# Patient Record
Sex: Male | Born: 1990 | Race: White | Hispanic: No | Marital: Single | Smoking: Never smoker
Health system: Southern US, Community
[De-identification: ages and names within clinical notes are randomized; demographics above are authoritative.]

## PROBLEM LIST (undated history)

## (undated) DIAGNOSIS — J309 Allergic rhinitis, unspecified: Secondary | ICD-10-CM

## (undated) DIAGNOSIS — F988 Other specified behavioral and emotional disorders with onset usually occurring in childhood and adolescence: Secondary | ICD-10-CM

## (undated) DIAGNOSIS — I10 Essential (primary) hypertension: Secondary | ICD-10-CM

## (undated) HISTORY — DX: Essential (primary) hypertension: I10

## (undated) HISTORY — DX: Other specified behavioral and emotional disorders with onset usually occurring in childhood and adolescence: F98.8

## (undated) HISTORY — DX: Allergic rhinitis, unspecified: J30.9

---
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
## 2010-07-25 ENCOUNTER — Emergency Department (HOSPITAL_COMMUNITY): Admission: EM | Admit: 2010-07-25 | Discharge: 2010-07-25 | Payer: Self-pay | Admitting: Emergency Medicine

## 2011-01-21 ENCOUNTER — Encounter: Payer: Self-pay | Admitting: Sports Medicine

## 2011-03-17 LAB — CBC
HCT: 47.2 % (ref 39.0–52.0)
Hemoglobin: 16.7 g/dL (ref 13.0–17.0)
MCH: 32.3 pg (ref 26.0–34.0)
Platelets: 156 10*3/uL (ref 150–400)
RDW: 12.8 % (ref 11.5–15.5)

## 2011-03-17 LAB — COMPREHENSIVE METABOLIC PANEL
ALT: 22 U/L (ref 0–53)
AST: 21 U/L (ref 0–37)
Albumin: 4.3 g/dL (ref 3.5–5.2)
BUN: 10 mg/dL (ref 6–23)
Chloride: 107 mEq/L (ref 96–112)
GFR calc Af Amer: >60 mL/min (ref >60)
GFR calc non Af Amer: >60 mL/min (ref >60)
Potassium: 4 mEq/L (ref 3.5–5.1)
Total Bilirubin: 1.3 mg/dL — ABNORMAL HIGH (ref 0.3–1.2)

## 2011-03-17 LAB — URINALYSIS, ROUTINE W REFLEX MICROSCOPIC
Bilirubin Urine: NEGATIVE
Glucose, UA: NEGATIVE mg/dL
Hgb urine dipstick: NEGATIVE
Ketones, ur: NEGATIVE mg/dL
Nitrite: NEGATIVE
pH: 6.5 (ref 5.0–8.0)

## 2011-03-17 LAB — DIFFERENTIAL
Eosinophils Absolute: 0.3 10*3/uL (ref 0.0–0.7)
Eosinophils Relative: 3 % (ref 0–5)
Lymphs Abs: 1.7 10*3/uL (ref 0.7–4.0)
Monocytes Absolute: 0.8 10*3/uL (ref 0.1–1.0)
Neutrophils Relative %: 67 % (ref 43–77)

## 2011-05-15 NOTE — Op Note (Signed)
NAMEAVIK, Charles NO.:  [id number]   MEDICAL RECORD NO.:  [id number]          PATIENT TYPE:  AMB   LOCATION:  DSC                          FACILITY:  [facility]   PHYSICIAN:  Mila Homer. Sherlean Foot, M.D. DATE OF BIRTH:  1991/05/24   DATE OF PROCEDURE:  11/07/2007  DATE OF DISCHARGE:                               OPERATIVE REPORT   SURGEON:  Mila Homer. Sherlean Foot, M.D.   ASSISTANTArlys John D. Petrarca, P.A.-C.   ANESTHESIA:  General.   PREOPERATIVE DIAGNOSIS:  Left knee anterior cruciate ligament tear.   POSTOPERATIVE DIAGNOSIS:  Left knee anterior cruciate ligament tear.   PROCEDURE:  Left knee anterior cruciate ligament reconstruction.   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  MRI showed small vertical  posterior tears in the horns of the medial and lateral menisci and a  full-thickness ACL tear.  Informed consent obtained.   DESCRIPTION OF PROCEDURE:  The patient was laid supine and administered  general anesthesia after preoperative femoral nerve block.  Left leg was  prepped and draped in usual sterile fashion.  Inferolateral and  inferomedial portals were created with a #11 blade, blunt trocar and  cannula.  Diagnostic arthroscopy revealed no chondromalacia in the knee.  When checked, the menisci and both tears were very, very small on the  undersurface and  very stable, and I did not feel it needed any devices  for repair.  I felt that they would heal on their own. They were in the  red/red zone and again very, very small and stable.  The ACL was fully  torn.  The notch was very narrow.  I think this was probably the  pathologic findings that predisposed him to ACL rupture.  I then put  removed the scope which was to the inferolateral portal only, made the  harvest incision after putting up the tourniquet to 350 mmHg.  This ran  from the inferior pole of patella to the tibial tubercle and  was made  with a #10 blade.  I then used a 15 blade to elevate the peritenon off  the tendon using 10-mm catamaran blade to take the central 1/3.  I then  used __________  to take a 10 x 10 x 25-mm bone plug of the patella and  tibial tubercle while the PA prepared the graft on the back table.  I  then put the scope back in inferolateral portal, made an inferomedial  portal through the harvest incision.  I then finished removal of ACL,  performed a notchplasty with a 4-mm cylindrical bur.  I then used the  Arthrex system to make a 10-mm tibial and femoral tunnels, passed the  graft with the Beath needle.  I then placed a 7 x 25-mm bone plug in the  femur and an 8 x 25 in the tibia.  The graft looked excellent in the  knee.  Drawer and Lachman were negative.  I then lavaged the knee,  closed with 0 and 2-0 Vicryl sutures,  Steri-Strips, Xeroform dressing,  sponges, Ace wrap and knee immobilizer.  Tourniquet time was 62 minutes.   COMPLICATIONS:  None.   DRAINS:  None.           ______________________________  Mila Homer. Sherlean Foot, M.D.     SDL/MEDQ  D:  11/07/2007  T:  11/07/2007  Job:  [id number]

## 2011-10-09 LAB — POCT HEMOGLOBIN-HEMACUE
Hemoglobin: 16.2 — ABNORMAL HIGH
Operator id: 123881

## 2012-07-20 IMAGING — CT CT ABD-PELV W/ CM
1 series · 15 of 32 positions shown, 19 images · IV contrast (agent unspecified)
Comparison: MRI [id number]

CLINICAL DATA: Right lower quadrant abdominal pain.

CT ABDOMEN AND PELVIS WITH CONTRAST
TECHNIQUE: Multidetector CT imaging of the abdomen and pelvis was
performed following the standard protocol during bolus
administration of intravenous contrast.
Contrast: 100 ml Lmnipaque-XBB

[Series 2: rtn ap with st · axial · 0.74mm/px · z∈[-280,+170]mm · 15 of 101 slices shown, 19 images]
[im 7/101  soft-tissue]
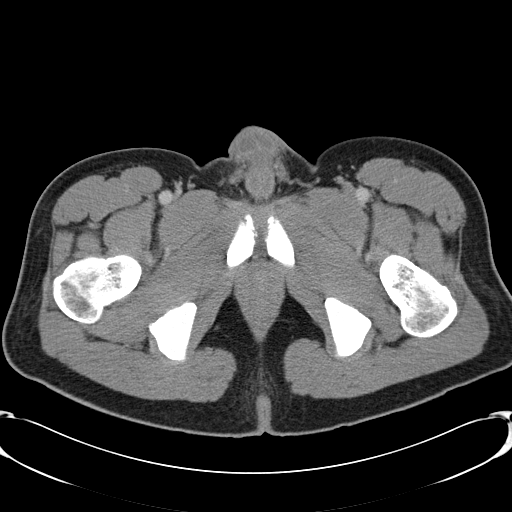
[im 7/101  bone]
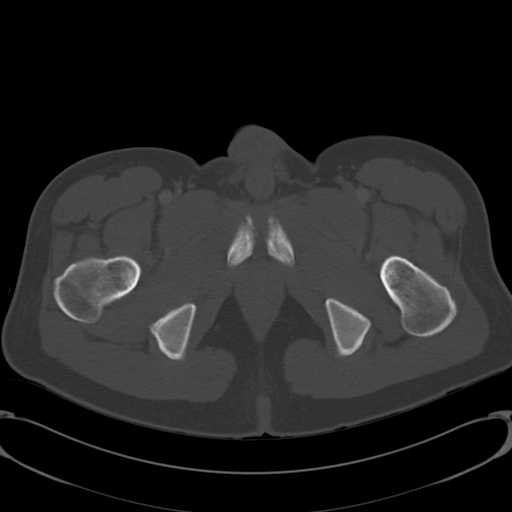
[im 13/101  soft-tissue]
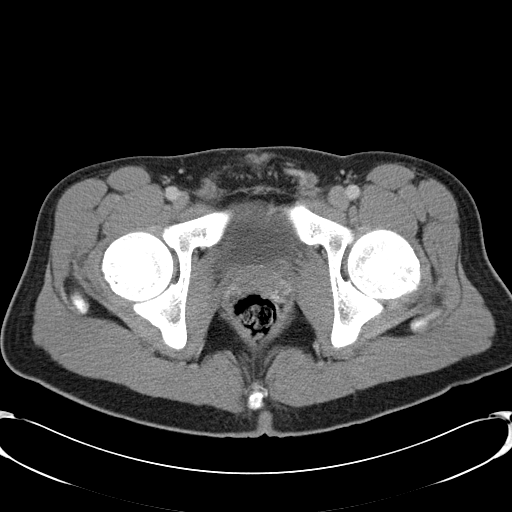
[im 20/101  soft-tissue]
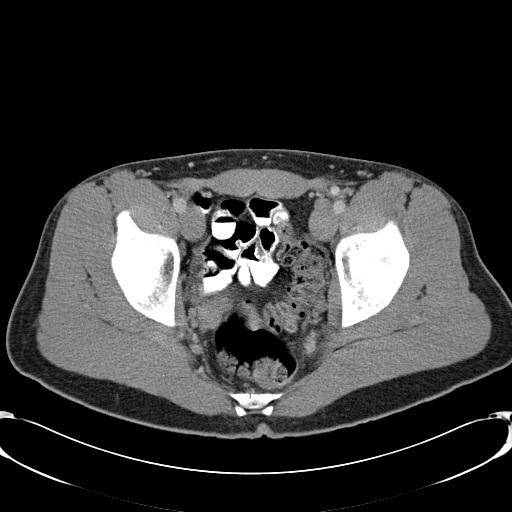
[im 30/101  soft-tissue]
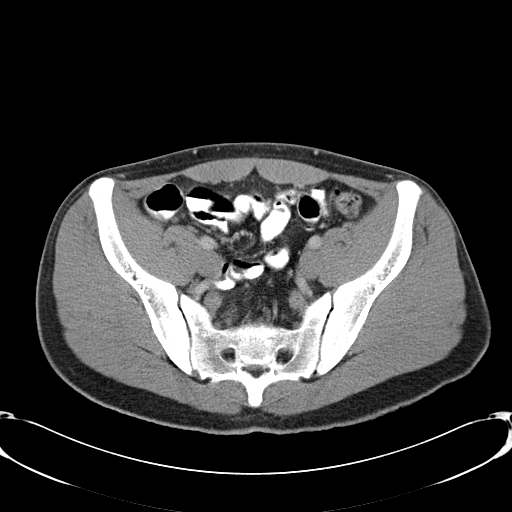
[im 36/101  soft-tissue]
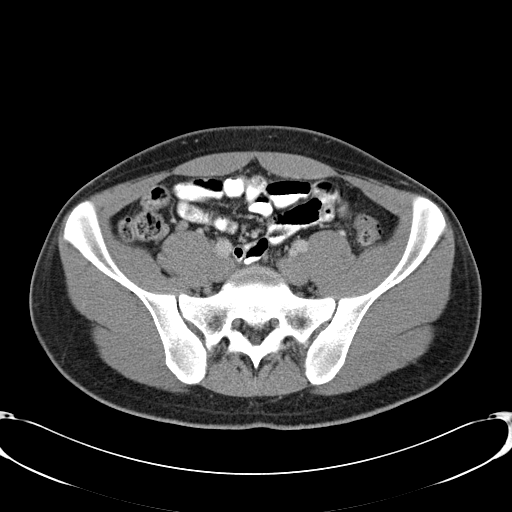
[im 42/101  soft-tissue]
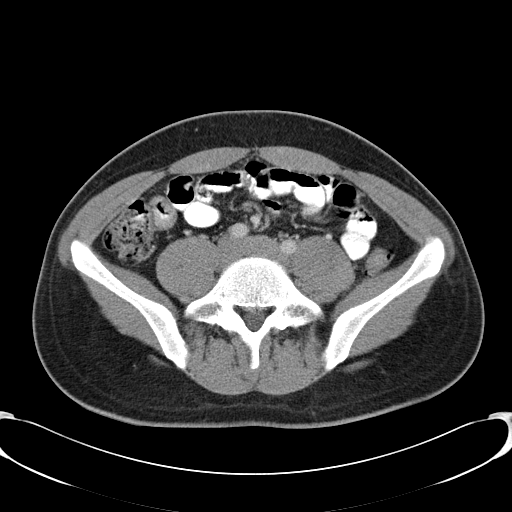
[im 52/101  soft-tissue]
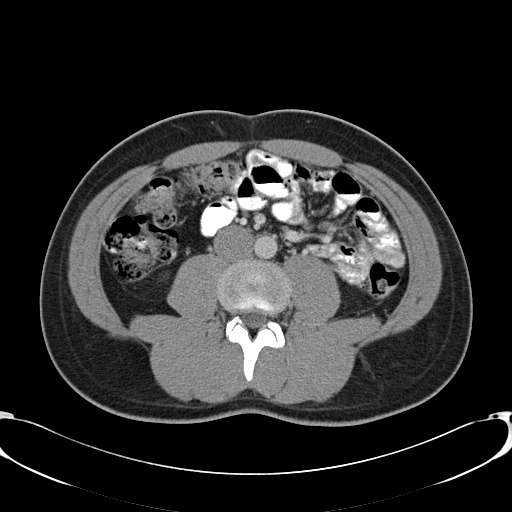
[im 59/101  soft-tissue]
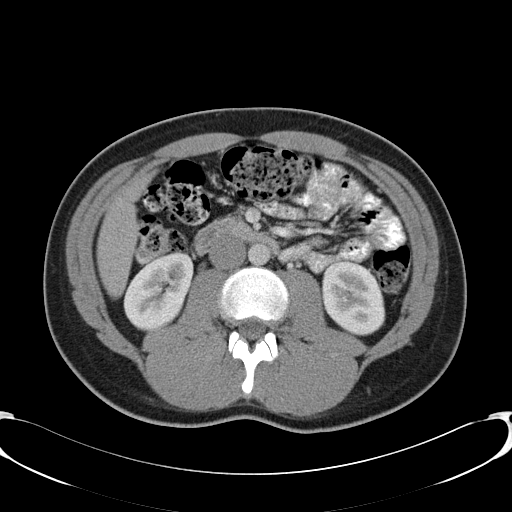
[im 65/101  soft-tissue]
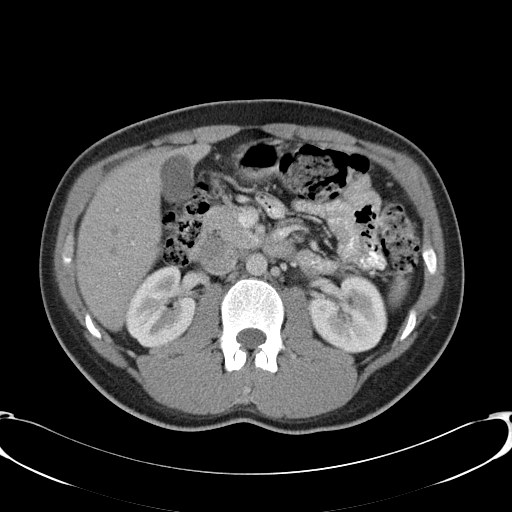
[im 65/101  bone]
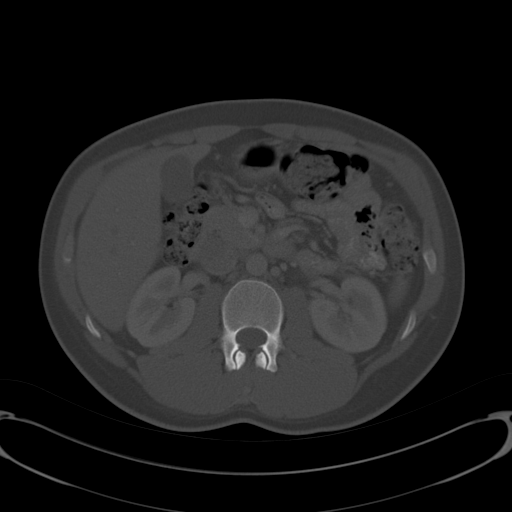
[im 71/101  soft-tissue]
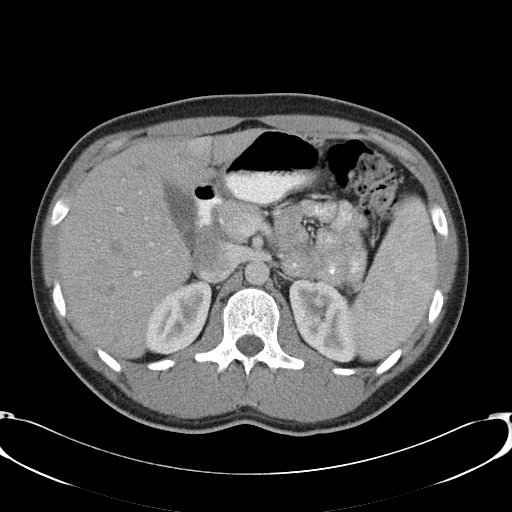
[im 81/101  soft-tissue]
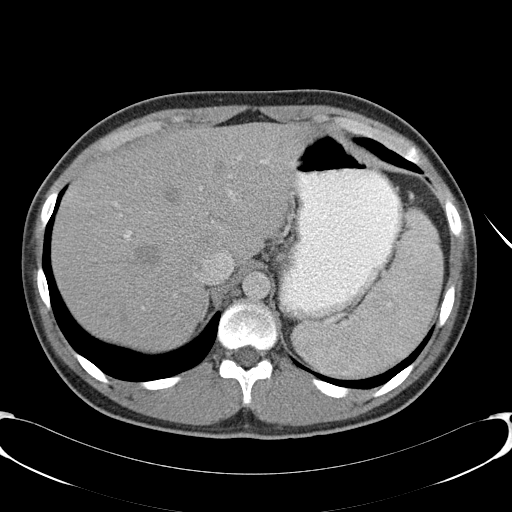
[im 88/101  soft-tissue]
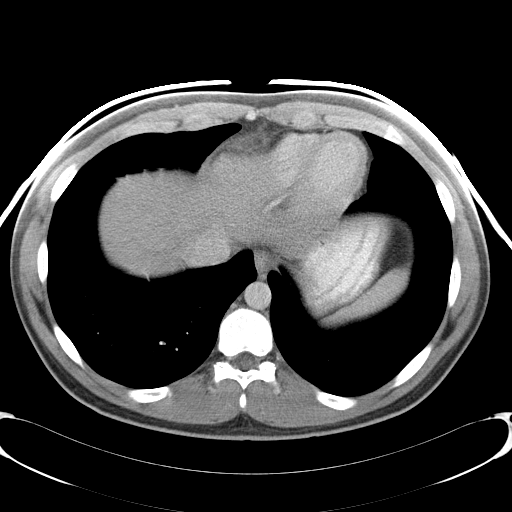
[im 88/101  lung]
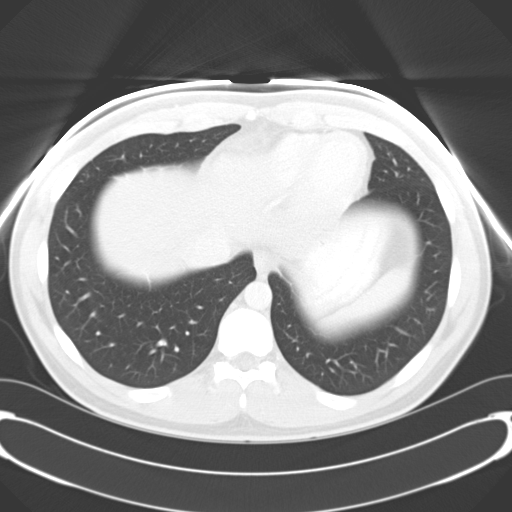
[im 91/101  lung]
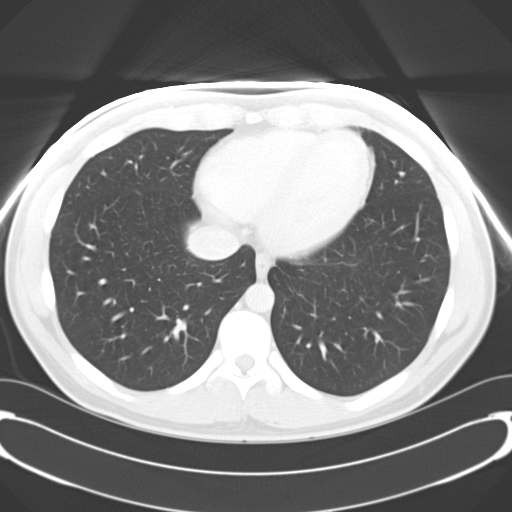
[im 94/101  soft-tissue]
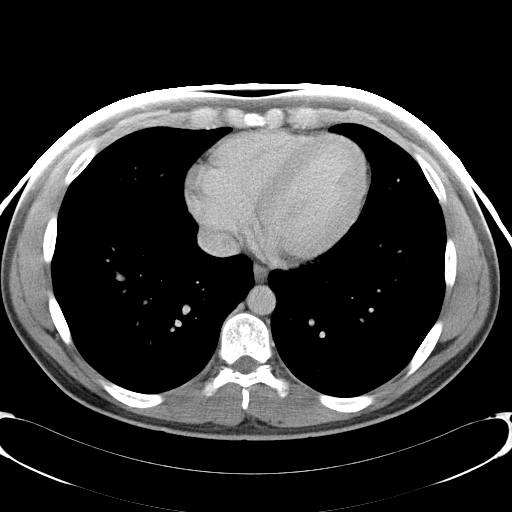
[im 94/101  lung]
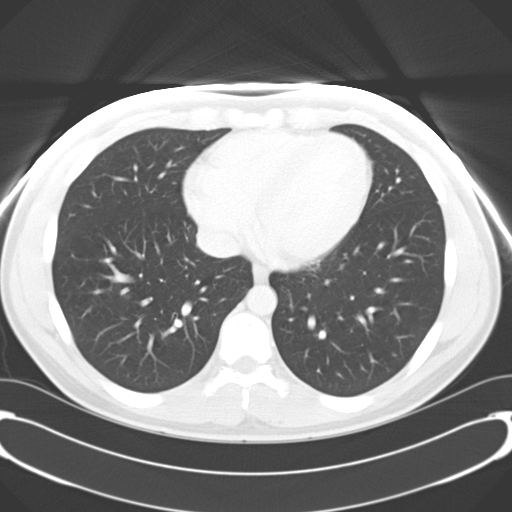
[im 97/101  lung]
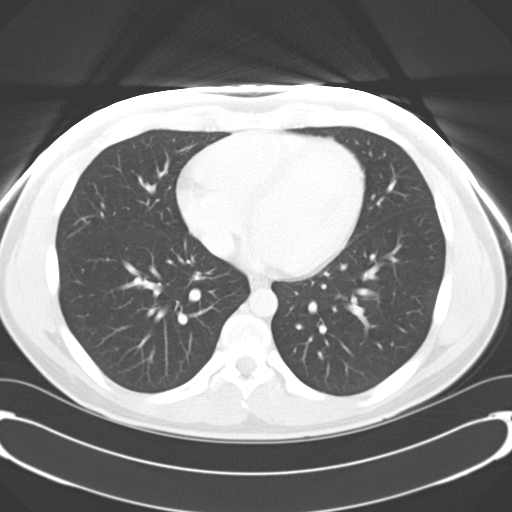

[15 of 32 positions shown; findings below may reference images not displayed]

FINDINGS: Lung bases are clear.  No pleural or pericardial fluid.
The liver, gallbladder, spleen, pancreas, adrenal glands and
kidneys are normal.  The aorta and IVC are normal.  No free fluid
or air.  Bladder, prostate gland and seminal vesicles are
unremarkable.

There is a fairly large amount of stool within the colon that could
possibly be symptomatic.

I think the appendix is a diminutive structure at the cecal tip.
No sign of appendicitis.

There is slightly prominent nodes in the mesentery in the right
lower quadrant.  This could indicate mesenteric adenitis.

The terminal ileum is questionably minimally thick-walled.  This is
not definitely abnormal.  It is conceivable there could be mild
viral enteritis.
IMPRESSION: No sign of appendicitis.  The appendix is a diminutive structure at
the cecal tip that does not show any inflammation.

Slightly prominent lymph nodes in the right lower quadrant
mesentery could indicate mesenteric adenitis.

Question minimal thickening of the terminal ileum.  This is not
definite.  This could possibly be associated with mild viral
inflammation.

## 2013-11-25 ENCOUNTER — Telehealth: Payer: Self-pay | Admitting: Internal Medicine

## 2013-11-25 ENCOUNTER — Other Ambulatory Visit: Payer: Self-pay | Admitting: Internal Medicine

## 2013-11-25 DIAGNOSIS — F988 Other specified behavioral and emotional disorders with onset usually occurring in childhood and adolescence: Secondary | ICD-10-CM | POA: Insufficient documentation

## 2013-11-25 MED ORDER — AMPHETAMINE-DEXTROAMPHETAMINE 10 MG PO TABS: 10.0000 mg | ORAL_TABLET | Freq: Every day | ORAL | Status: DC

## 2013-11-25 NOTE — Telephone Encounter (Signed)
Pt requesting Adderall  rx  No appt shcd

## 2013-12-01 ENCOUNTER — Other Ambulatory Visit: Payer: Self-pay | Admitting: Physician Assistant

## 2013-12-01 DIAGNOSIS — F988 Other specified behavioral and emotional disorders with onset usually occurring in childhood and adolescence: Secondary | ICD-10-CM

## 2013-12-01 MED ORDER — AMPHETAMINE-DEXTROAMPHETAMINE 10 MG PO TABS: 10.0000 mg | ORAL_TABLET | Freq: Every day | ORAL | Status: DC

## 2014-02-04 ENCOUNTER — Other Ambulatory Visit: Payer: Self-pay | Admitting: Physician Assistant

## 2014-02-04 DIAGNOSIS — F988 Other specified behavioral and emotional disorders with onset usually occurring in childhood and adolescence: Secondary | ICD-10-CM

## 2014-02-04 MED ORDER — PIROXICAM 20 MG PO CAPS: 20.0000 mg | ORAL_CAPSULE | Freq: Every day | ORAL | Status: DC | PRN

## 2014-02-04 MED ORDER — AMPHETAMINE-DEXTROAMPHETAMINE 10 MG PO TABS: 10.0000 mg | ORAL_TABLET | Freq: Three times a day (TID) | ORAL | Status: DC

## 2014-02-09 NOTE — Progress Notes (Signed)
LMOM TO CALL & SCHEDULE A F/U

## 2014-03-26 NOTE — Progress Notes (Signed)
LEFT MESSAGE FOR PT TO CALL AND SCH OV ° ° °

## 2014-04-20 ENCOUNTER — Other Ambulatory Visit: Payer: Self-pay | Admitting: Internal Medicine

## 2014-04-20 DIAGNOSIS — F988 Other specified behavioral and emotional disorders with onset usually occurring in childhood and adolescence: Secondary | ICD-10-CM

## 2014-04-20 MED ORDER — PIROXICAM 20 MG PO CAPS: 20.0000 mg | ORAL_CAPSULE | Freq: Every day | ORAL | Status: DC | PRN

## 2014-04-20 MED ORDER — AMPHETAMINE-DEXTROAMPHETAMINE 10 MG PO TABS: ORAL_TABLET | ORAL | Status: DC

## 2014-06-25 ENCOUNTER — Encounter: Payer: Self-pay | Admitting: Physician Assistant

## 2014-06-25 ENCOUNTER — Ambulatory Visit (INDEPENDENT_AMBULATORY_CARE_PROVIDER_SITE_OTHER): Payer: BC Managed Care – PPO | Admitting: Physician Assistant

## 2014-06-25 VITALS — BP 122/82 | HR 52 | Temp 98.1°F | Resp 16 | Ht 76.0 in | Wt 218.0 lb

## 2014-06-25 DIAGNOSIS — K2100 Gastro-esophageal reflux disease with esophagitis, without bleeding: Secondary | ICD-10-CM

## 2014-06-25 DIAGNOSIS — Z79899 Other long term (current) drug therapy: Secondary | ICD-10-CM

## 2014-06-25 DIAGNOSIS — F988 Other specified behavioral and emotional disorders with onset usually occurring in childhood and adolescence: Secondary | ICD-10-CM

## 2014-06-25 DIAGNOSIS — R1013 Epigastric pain: Secondary | ICD-10-CM

## 2014-06-25 DIAGNOSIS — E559 Vitamin D deficiency, unspecified: Secondary | ICD-10-CM

## 2014-06-25 LAB — CBC WITH DIFFERENTIAL/PLATELET
BASOS ABS: 0 10*3/uL (ref 0.0–0.1)
BASOS PCT: 0 % (ref 0–1)
Eosinophils Absolute: 0.3 10*3/uL (ref 0.0–0.7)
Eosinophils Relative: 6 % — ABNORMAL HIGH (ref 0–5)
HCT: 43.6 % (ref 39.0–52.0)
Hemoglobin: 15.8 g/dL (ref 13.0–17.0)
LYMPHS ABS: 1.5 10*3/uL (ref 0.7–4.0)
Lymphocytes Relative: 32 % (ref 12–46)
MCH: 31.9 pg (ref 26.0–34.0)
MCHC: 36.2 g/dL — AB (ref 30.0–36.0)
MCV: 88.1 fL (ref 78.0–100.0)
MONOS PCT: 8 % (ref 3–12)
Monocytes Absolute: 0.4 10*3/uL (ref 0.1–1.0)
NEUTROS ABS: 2.6 10*3/uL (ref 1.7–7.7)
NEUTROS PCT: 54 % (ref 43–77)
Platelets: 176 10*3/uL (ref 150–400)
RBC: 4.95 MIL/uL (ref 4.22–5.81)
RDW: 13.4 % (ref 11.5–15.5)
WBC: 4.8 10*3/uL (ref 4.0–10.5)

## 2014-06-25 LAB — BASIC METABOLIC PANEL WITH GFR
BUN: 12 mg/dL (ref 6–23)
CALCIUM: 9.4 mg/dL (ref 8.4–10.5)
CO2: 25 meq/L (ref 19–32)
CREATININE: 0.96 mg/dL (ref 0.50–1.35)
Chloride: 105 mEq/L (ref 96–112)
GFR, Est African American: >89 mL/min
GFR, Est Non African American: >89 mL/min
Glucose, Bld: 92 mg/dL (ref 70–99)
Potassium: 4.4 mEq/L (ref 3.5–5.3)
SODIUM: 141 meq/L (ref 135–145)

## 2014-06-25 LAB — HEPATIC FUNCTION PANEL
ALBUMIN: 5 g/dL (ref 3.5–5.2)
ALK PHOS: 82 U/L (ref 39–117)
ALT: 21 U/L (ref 0–53)
AST: 20 U/L (ref 0–37)
BILIRUBIN INDIRECT: 0.8 mg/dL (ref 0.2–1.2)
Bilirubin, Direct: 0.3 mg/dL (ref 0.0–0.3)
Total Bilirubin: 1.1 mg/dL (ref 0.2–1.2)
Total Protein: 6.8 g/dL (ref 6.0–8.3)

## 2014-06-25 LAB — MAGNESIUM: Magnesium: 2 mg/dL (ref 1.5–2.5)

## 2014-06-25 LAB — AMYLASE: Amylase: 50 U/L (ref 0–105)

## 2014-06-25 MED ORDER — PANTOPRAZOLE SODIUM 40 MG PO TBEC: 40.0000 mg | DELAYED_RELEASE_TABLET | Freq: Two times a day (BID) | ORAL | Status: DC

## 2014-06-25 MED ORDER — AMPHETAMINE-DEXTROAMPHETAMINE 20 MG PO TABS: ORAL_TABLET | ORAL | Status: DC

## 2014-06-25 NOTE — Progress Notes (Signed)
Assessment and Plan:  ADD-  Continue ADD medication, will do 20mg  BID, helps with focus, no AE's. The patient was counseled on the addictive nature of the medication and was encouraged to take drug holidays when not needed.  Epigastric pain: ? Ulcer from piroxicam/alcohol check labs, bland diet, small portions, increase H20, PPI x 4 weeks If it is not better than will refer to GI Cerumen impaction- use oil at home, stop using qtips, can call to get irrigation if needed.   Continue diet and meds as discussed. Further disposition pending results of labs.  HPI 23 y.o. male  presents for 3 month follow up with hypertension, hyperlipidemia, prediabetes and vitamin D. His blood pressure has been controlled at home, today their BP is BP: 122/82 mmHg Patient is on an ADD medication, he states that the medication is helping and he denies any ADD's.  He has been having intermittent epigastric pain for the past year, he first had AB pain with heavy drinking, he had a negative Hylori and was treated with Nexium which helped. Cramping, sharp pains, better with lying down. He is on the piroxicam due to left meniscal tear. States it is worse when he eats a lot at once, worse after drinking alcohol, worse with fatty foods/junk food. Denies dark black stool, blood in stool, no radiation, nausea, vomiting.  He has lost about 20 lbs over 1 year.  He has has some left ear pain, used swimmer's ear, pain is better but it feels like there is something in it.   Current Medications:  Current Outpatient Prescriptions on File Prior to Visit  Medication Sig Dispense Refill  . amphetamine-dextroamphetamine (ADDERALL) 10 MG tablet Take 1/2 to 1 tablet 2 x day if needed for ADD  28 tablet  0  . piroxicam (FELDENE) 20 MG capsule Take 1 capsule (20 mg total) by mouth daily as needed (for pain).  14 capsule  0   No current facility-administered medications on file prior to visit.   Medical History: No past medical history on  file. Allergies: Allergies not on file   Review of Systems: [X]  = complains of  [ ]  = denies  General: Fatigue [ ]  Fever [ ]  Chills [ ]  Weakness [ ]   Insomnia [ ]  Eyes: Redness [ ]  Blurred vision [ ]  Diplopia [ ]   ENT: Congestion [ ]  Sinus Pain [ ]  Post Nasal Drip [ ]  Sore Throat [ ]  Earache Arly.Keller[X ]  Cardiac: Chest pain/pressure [ ]  SOB [ ]  Orthopnea [ ]   Palpitations [ ]   Paroxysmal nocturnal dyspnea[ ]  Claudication [ ]  Edema [ ]   Pulmonary: Cough [ ]  Wheezing[ ]   SOB [ ]   Snoring [ ]   GI: Nausea [ ]  Vomiting[ ]  Dysphagia[ ]  Heartburn[ ]  Abdominal pain Arly.Keller[X ] Constipation [ ] ; Diarrhea [ ] ; BRBPR [ ]  Melena[ ]  GU: Hematuria[ ]  Dysuria [ ]  Nocturia[ ]  Urgency [ ]   Hesitancy [ ]  Discharge [ ]  Neuro: Headaches[ ]  Vertigo[ ]  Paresthesias[ ]  Spasm [ ]  Speech changes [ ]  Incoordination [ ]   Ortho: Arthritis [ ]  Joint pain [ ]  Muscle pain [ ]  Joint swelling [ ]  Back Pain [ ]  Skin:  Rash [ ]   Pruritis [ ]  Change in skin lesion [ ]   Psych: Depression[ ]  Anxiety[ ]  Confusion [ ]  Memory loss [ ]   Heme/Lypmh: Bleeding [ ]  Bruising [ ]  Enlarged lymph nodes [ ]   Endocrine: Visual blurring [ ]  Paresthesia [ ]  Polyuria [ ]  Polydypsea [ ]   Heat/cold intolerance [ ]  Hypoglycemia [ ]   Family history- Review and unchanged Social history- Review and unchanged Physical Exam: BP 122/82  Pulse 52  Temp(Src) 98.1 F (36.7 C)  Resp 16  Ht 6\' 4"  (1.93 m)  Wt 218 lb (98.884 kg)  BMI 26.55 kg/m2 Wt Readings from Last 3 Encounters:  06/25/14 218 lb (98.884 kg)   General Appearance: Well nourished, in no apparent distress. Eyes: PERRLA, EOMs, conjunctiva no swelling or erythema Sinuses: No Frontal/maxillary tenderness ENT/Mouth: Ext aud canals clear in right but cerumen impaction in the left ear, TMs without erythema, bulging in the right ear. No erythema, swelling, or exudate on post pharynx.  Tonsils not swollen or erythematous. Hearing normal.  Neck: Supple, thyroid normal.  Respiratory: Respiratory effort  normal, BS equal bilaterally without rales, rhonchi, wheezing or stridor.  Cardio: RRR with no MRGs. Brisk peripheral pulses without edema.  Abdomen: Soft, + BS, + epigastric tenderness, no guarding, rebound, hernias, masses. Lymphatics: Non tender without lymphadenopathy.  Musculoskeletal: Full ROM, 5/5 strength, normal gait.  Skin: Warm, dry without rashes, lesions, ecchymosis.  Neuro: Cranial nerves intact. Normal muscle tone, no cerebellar symptoms. Sensation intact.  Psych: Awake and oriented X 3, normal affect, Insight and Judgment appropriate.    Charles Tucker, Charles Tucker 10:53 AM

## 2014-06-25 NOTE — Patient Instructions (Signed)
Use a dropper to put olive oil or canola oil in the effected ear- 2-3 times a week. Let it soak for 20-30 min then you can take a shower or use a baby bulb with warm water to wash out the ear wax.  Do not use Qtips  Food Choices for Gastroesophageal Reflux Disease When you have gastroesophageal reflux disease (GERD), the foods you eat and your eating habits are very important. Choosing the right foods can help ease the discomfort of GERD. WHAT GENERAL GUIDELINES DO I NEED TO FOLLOW?  Choose fruits, vegetables, whole grains, low-fat dairy products, and low-fat meat, fish, and poultry.  Limit fats such as oils, salad dressings, butter, nuts, and avocado.  Keep a food diary to identify foods that cause symptoms.  Avoid foods that cause reflux. These may be different for different people.  Eat frequent small meals instead of three large meals each day.  Eat your meals slowly, in a relaxed setting.  Limit fried foods.  Cook foods using methods other than frying.  Avoid drinking alcohol.  Avoid drinking large amounts of liquids with your meals.  Avoid bending over or lying down until 2-3 hours after eating. WHAT FOODS ARE NOT RECOMMENDED? The following are some foods and drinks that may worsen your symptoms: Vegetables Tomatoes. Tomato juice. Tomato and spaghetti sauce. Chili peppers. Onion and garlic. Horseradish. Fruits Oranges, grapefruit, and lemon (fruit and juice). Meats High-fat meats, fish, and poultry. This includes hot dogs, ribs, ham, sausage, salami, and bacon. Dairy Whole milk and chocolate milk. Sour cream. Cream. Butter. Ice cream. Cream cheese.  Beverages Coffee and tea, with or without caffeine. Carbonated beverages or energy drinks. Condiments Hot sauce. Barbecue sauce.  Sweets/Desserts Chocolate and cocoa. Donuts. Peppermint and spearmint. Fats and Oils High-fat foods, including JamaicaFrench fries and potato chips. Other Vinegar. Strong spices, such as black  pepper, white pepper, red pepper, cayenne, curry powder, cloves, ginger, and chili powder. The items listed above may not be a complete list of foods and beverages to avoid. Contact your dietitian for more information. Document Released: 12/17/2005 Document Revised: 12/22/2013 Document Reviewed: 10/21/2013 [facility] Patient Information 2015 [place], [place]. This information is not intended to replace advice given to you by your health care provider. Make sure you discuss any questions you have with your health care provider.  Peptic Ulcer A peptic ulcer is a sore in the lining of in your esophagus (esophageal ulcer), stomach (gastric ulcer), or in the first part of your small intestine (duodenal ulcer). The ulcer causes erosion into the deeper tissue. CAUSES  Normally, the lining of the stomach and the small intestine protects itself from the acid that digests food. The protective lining can be damaged by:  An infection caused by a bacterium called Helicobacter pylori (H. pylori).  Regular use of nonsteroidal anti-inflammatory drugs (NSAIDs), such as ibuprofen or aspirin.  Smoking tobacco. Other risk factors include being older than 50, drinking alcohol excessively, and having a family history of ulcer disease.  SYMPTOMS   Burning pain or gnawing in the area between the chest and the belly button.  Heartburn.  Nausea and vomiting.  Bloating. The pain can be worse on an empty stomach and at night. If the ulcer results in bleeding, it can cause:  Black, tarry stools.  Vomiting of bright red blood.  Vomiting of coffee ground looking materials. DIAGNOSIS  A diagnosis is usually made based upon your history and an exam. Other tests and procedures may be performed to find the  cause of the ulcer. Finding a cause will help determine the best treatment. Tests and procedures may include:  Blood tests, stool tests, or breath tests to check for the bacterium H. pylori.  An upper  gastrointestinal (GI) series of the esophagus, stomach, and small intestine.  An endoscopy to examine the esophagus, stomach, and small intestine.  A biopsy. TREATMENT  Treatment may include:  Eliminating the cause of the ulcer, such as smoking, NSAIDs, or alcohol.  Medicines to reduce the amount of acid in your digestive tract.  Antibiotic medicines if the ulcer is caused by the H. pylori bacterium.  An upper endoscopy to treat a bleeding ulcer.  Surgery if the bleeding is severe or if the ulcer created a hole somewhere in the digestive system. HOME CARE INSTRUCTIONS   Avoid tobacco, alcohol, and caffeine. Smoking can increase the acid in the stomach, and continued smoking will impair the healing of ulcers.  Avoid foods and drinks that seem to cause discomfort or aggravate your ulcer.  Only take medicines as directed by your caregiver. Do not substitute over-the-counter medicines for prescription medicines without talking to your caregiver.  Keep any follow-up appointments and tests as directed. SEEK MEDICAL CARE IF:   Your do not improve within 7 days of starting treatment.  You have ongoing indigestion or heartburn. SEEK IMMEDIATE MEDICAL CARE IF:   You have sudden, sharp, or persistent abdominal pain.  You have bloody or dark black, tarry stools.  You vomit blood or vomit that looks like coffee grounds.  You become light headed, weak, or feel faint.  You become sweaty or clammy. MAKE SURE YOU:   Understand these instructions.  Will watch your condition.  Will get help right away if you are not doing well or get worse. Document Released: 12/14/2000 Document Revised: 09/10/2012 Document Reviewed: 07/16/2012 [facility] Patient Information 2015 [place], [place]. This information is not intended to replace advice given to you by your health care provider. Make sure you discuss any questions you have with your health care provider.

## 2014-06-26 LAB — VITAMIN D 25 HYDROXY (VIT D DEFICIENCY, FRACTURES): Vit D, 25-Hydroxy: 68 ng/mL (ref 30–89)

## 2014-06-29 LAB — HELICOBACTER PYLORI ABS-IGG+IGA, BLD
H Pylori IgG: <0.4 {ISR}
HELICOBACTER PYLORI AB, IGA: 0.6 U/mL (ref <9.0)

## 2014-07-29 ENCOUNTER — Other Ambulatory Visit: Payer: Self-pay | Admitting: Internal Medicine

## 2014-07-29 DIAGNOSIS — F988 Other specified behavioral and emotional disorders with onset usually occurring in childhood and adolescence: Secondary | ICD-10-CM

## 2014-07-29 MED ORDER — AMPHETAMINE-DEXTROAMPHETAMINE 20 MG PO TABS: ORAL_TABLET | ORAL | Status: DC

## 2014-09-20 ENCOUNTER — Other Ambulatory Visit: Payer: Self-pay | Admitting: Physician Assistant

## 2014-09-20 DIAGNOSIS — F988 Other specified behavioral and emotional disorders with onset usually occurring in childhood and adolescence: Secondary | ICD-10-CM

## 2014-09-20 MED ORDER — AMPHETAMINE-DEXTROAMPHETAMINE 20 MG PO TABS: ORAL_TABLET | ORAL | Status: DC

## 2014-09-26 DIAGNOSIS — J309 Allergic rhinitis, unspecified: Secondary | ICD-10-CM | POA: Insufficient documentation

## 2014-09-26 DIAGNOSIS — I10 Essential (primary) hypertension: Secondary | ICD-10-CM | POA: Insufficient documentation

## 2014-10-01 ENCOUNTER — Ambulatory Visit: Payer: Self-pay | Admitting: Physician Assistant

## 2014-10-08 ENCOUNTER — Ambulatory Visit: Payer: Self-pay | Admitting: Physician Assistant

## 2014-10-11 ENCOUNTER — Ambulatory Visit (INDEPENDENT_AMBULATORY_CARE_PROVIDER_SITE_OTHER): Payer: BC Managed Care – PPO | Admitting: Physician Assistant

## 2014-10-11 ENCOUNTER — Encounter: Payer: Self-pay | Admitting: Physician Assistant

## 2014-10-11 VITALS — BP 128/80 | HR 65 | Temp 98.1°F | Resp 16 | Ht 76.0 in | Wt 210.0 lb

## 2014-10-11 DIAGNOSIS — F988 Other specified behavioral and emotional disorders with onset usually occurring in childhood and adolescence: Secondary | ICD-10-CM

## 2014-10-11 DIAGNOSIS — Z23 Encounter for immunization: Secondary | ICD-10-CM

## 2014-10-11 DIAGNOSIS — E559 Vitamin D deficiency, unspecified: Secondary | ICD-10-CM

## 2014-10-11 DIAGNOSIS — K2100 Gastro-esophageal reflux disease with esophagitis, without bleeding: Secondary | ICD-10-CM

## 2014-10-11 DIAGNOSIS — Z711 Person with feared health complaint in whom no diagnosis is made: Secondary | ICD-10-CM

## 2014-10-11 DIAGNOSIS — F909 Attention-deficit hyperactivity disorder, unspecified type: Secondary | ICD-10-CM

## 2014-10-11 LAB — CBC WITH DIFFERENTIAL/PLATELET
BASOS ABS: 0 10*3/uL (ref 0.0–0.1)
Basophils Relative: 0 % (ref 0–1)
EOS ABS: 0.4 10*3/uL (ref 0.0–0.7)
EOS PCT: 8 % — AB (ref 0–5)
HCT: 46.4 % (ref 39.0–52.0)
HEMOGLOBIN: 16.6 g/dL (ref 13.0–17.0)
LYMPHS ABS: 1.4 10*3/uL (ref 0.7–4.0)
LYMPHS PCT: 26 % (ref 12–46)
MCH: 31.5 pg (ref 26.0–34.0)
MCHC: 35.8 g/dL (ref 30.0–36.0)
MCV: 88 fL (ref 78.0–100.0)
MONO ABS: 0.5 10*3/uL (ref 0.1–1.0)
MONOS PCT: 10 % (ref 3–12)
Neutro Abs: 3 10*3/uL (ref 1.7–7.7)
Neutrophils Relative %: 56 % (ref 43–77)
Platelets: 178 10*3/uL (ref 150–400)
RBC: 5.27 MIL/uL (ref 4.22–5.81)
RDW: 13.4 % (ref 11.5–15.5)
WBC: 5.4 10*3/uL (ref 4.0–10.5)

## 2014-10-11 LAB — HEPATIC FUNCTION PANEL
ALBUMIN: 4.5 g/dL (ref 3.5–5.2)
ALK PHOS: 83 U/L (ref 39–117)
ALT: 19 U/L (ref 0–53)
AST: 19 U/L (ref 0–37)
BILIRUBIN DIRECT: 0.2 mg/dL (ref 0.0–0.3)
BILIRUBIN INDIRECT: 0.9 mg/dL (ref 0.2–1.2)
BILIRUBIN TOTAL: 1.1 mg/dL (ref 0.2–1.2)
TOTAL PROTEIN: 6.8 g/dL (ref 6.0–8.3)

## 2014-10-11 LAB — BASIC METABOLIC PANEL WITH GFR
BUN: 11 mg/dL (ref 6–23)
CO2: 28 mEq/L (ref 19–32)
CREATININE: 1.01 mg/dL (ref 0.50–1.35)
Calcium: 10 mg/dL (ref 8.4–10.5)
Chloride: 102 mEq/L (ref 96–112)
GFR, Est African American: >89 mL/min
GFR, Est Non African American: >89 mL/min
GLUCOSE: 88 mg/dL (ref 70–99)
Potassium: 4.8 mEq/L (ref 3.5–5.3)
Sodium: 139 mEq/L (ref 135–145)

## 2014-10-11 MED ORDER — AMPHETAMINE-DEXTROAMPHETAMINE 20 MG PO TABS
ORAL_TABLET | ORAL | Status: DC
Start: 2014-10-11 — End: 2014-11-24

## 2014-10-11 NOTE — Patient Instructions (Signed)
Avoid alcohol, spicy foods, NSAIDS (aleve, ibuprofen) at this time. See foods below.   Food Choices for Gastroesophageal Reflux Disease When you have gastroesophageal reflux disease (GERD), the foods you eat and your eating habits are very important. Choosing the right foods can help ease the discomfort of GERD. WHAT GENERAL GUIDELINES DO I NEED TO FOLLOW?  Choose fruits, vegetables, whole grains, low-fat dairy products, and low-fat meat, fish, and poultry.  Limit fats such as oils, salad dressings, butter, nuts, and avocado.  Keep a food diary to identify foods that cause symptoms.  Avoid foods that cause reflux. These may be different for different people.  Eat frequent small meals instead of three large meals each day.  Eat your meals slowly, in a relaxed setting.  Limit fried foods.  Cook foods using methods other than frying.  Avoid drinking alcohol.  Avoid drinking large amounts of liquids with your meals.  Avoid bending over or lying down until 2-3 hours after eating. WHAT FOODS ARE NOT RECOMMENDED? The following are some foods and drinks that may worsen your symptoms: Vegetables Tomatoes. Tomato juice. Tomato and spaghetti sauce. Chili peppers. Onion and garlic. Horseradish. Fruits Oranges, grapefruit, and lemon (fruit and juice). Meats High-fat meats, fish, and poultry. This includes hot dogs, ribs, ham, sausage, salami, and bacon. Dairy Whole milk and chocolate milk. Sour cream. Cream. Butter. Ice cream. Cream cheese.  Beverages Coffee and tea, with or without caffeine. Carbonated beverages or energy drinks. Condiments Hot sauce. Barbecue sauce.  Sweets/Desserts Chocolate and cocoa. Donuts. Peppermint and spearmint. Fats and Oils High-fat foods, including JamaicaFrench fries and potato chips. Other Vinegar. Strong spices, such as black pepper, white pepper, red pepper, cayenne, curry powder, cloves, ginger, and chili powder.

## 2014-10-11 NOTE — Progress Notes (Signed)
HPI 23 y.o.male presents for follow up. 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 He was complaining of right shoulder blade pain, worse standing all day, better with rest. Some weakness with opening bottles/jars, worse in the AM. He is still taking his adderall for work.   Past Medical History  Diagnosis Date  . ADD (attention deficit disorder)   . Allergic rhinitis   . Hypertension      Allergies no known allergies    Current Outpatient Prescriptions on File Prior to Visit  Medication Sig Dispense Refill  . amphetamine-dextroamphetamine (ADDERALL) 20 MG tablet Take 1/2 to 1 tablet 2 x day if needed for ADD  60 tablet  0  . pantoprazole (PROTONIX) 40 MG tablet Take 1 tablet (40 mg total) by mouth 2 (two) times daily.  60 tablet  1  . piroxicam (FELDENE) 20 MG capsule Take 1 capsule (20 mg total) by mouth daily as needed (for pain).  14 capsule  0   No current facility-administered medications on file prior to visit.    ROS: all negative expect above.   Physical: There were no vitals filed for this visit. There were no vitals taken for this visit. General Appearance: Well nourished, in no apparent distress. Eyes: PERRLA, EOMs. Sinuses: No Frontal/maxillary tenderness ENT/Mouth: Ext aud canals clear, normal light reflex with TMs without erythema, bulging. Post pharynx without erythema, swelling, exudate.  Respiratory: CTAB Cardio: RRR, no murmurs, rubs or gallops. Peripheral pulses brisk and equal bilaterally, without edema. No aortic or femoral bruits. Abdomen: Soft, with bowl sounds. Nontender, no guarding, rebound. Lymphatics: Non tender without lymphadenopathy.  Musculoskeletal: Full ROM all peripheral extremities, 5/5 strength, and normal gait. Skin: Warm,  dry without rashes, lesions, ecchymosis.  Neuro: Cranial nerves intact, reflexes equal bilaterally. Normal muscle tone, no cerebellar symptoms. Sensation intact.  Pysch: Awake and oriented X 3, normal affect, Insight and Judgment appropriate.   Assessment and Plan: ADD-  Continue ADD medication, helps with focus, no AE's. The patient was counseled on the addictive nature of the medication and was encouraged to take drug holidays when not needed.  Back pain- get cervical neck pillow, tylenol, call if not better GERD- diet improved STD screening per patient w/o signs of infection

## 2014-10-12 LAB — VITAMIN D 25 HYDROXY (VIT D DEFICIENCY, FRACTURES): Vit D, 25-Hydroxy: 56 ng/mL (ref 30–89)

## 2014-10-12 LAB — RPR

## 2014-10-12 LAB — GC/CHLAMYDIA PROBE AMP, URINE
Chlamydia, Swab/Urine, PCR: NEGATIVE
GC Probe Amp, Urine: NEGATIVE

## 2014-10-12 LAB — HSV(HERPES SIMPLEX VRS) I + II AB-IGG
HSV 1 Glycoprotein G Ab, IgG: 5.69 IV — ABNORMAL HIGH
HSV 2 Glycoprotein G Ab, IgG: <0.1 IV

## 2014-10-12 LAB — HIV ANTIBODY (ROUTINE TESTING W REFLEX): HIV: NONREACTIVE

## 2014-11-24 ENCOUNTER — Other Ambulatory Visit: Payer: Self-pay | Admitting: Physician Assistant

## 2014-11-24 DIAGNOSIS — F988 Other specified behavioral and emotional disorders with onset usually occurring in childhood and adolescence: Secondary | ICD-10-CM

## 2014-11-24 MED ORDER — AMPHETAMINE-DEXTROAMPHETAMINE 20 MG PO TABS: ORAL_TABLET | ORAL | Status: DC

## 2014-12-29 ENCOUNTER — Other Ambulatory Visit: Payer: Self-pay | Admitting: Physician Assistant

## 2014-12-29 DIAGNOSIS — F988 Other specified behavioral and emotional disorders with onset usually occurring in childhood and adolescence: Secondary | ICD-10-CM

## 2014-12-29 MED ORDER — AMPHETAMINE-DEXTROAMPHETAMINE 20 MG PO TABS: ORAL_TABLET | ORAL | Status: DC

## 2015-03-07 ENCOUNTER — Other Ambulatory Visit: Payer: Self-pay | Admitting: Physician Assistant

## 2015-03-07 DIAGNOSIS — F988 Other specified behavioral and emotional disorders with onset usually occurring in childhood and adolescence: Secondary | ICD-10-CM

## 2015-03-07 MED ORDER — AMPHETAMINE-DEXTROAMPHETAMINE 20 MG PO TABS
ORAL_TABLET | ORAL | Status: DC
Start: 2015-03-07 — End: 2015-04-08

## 2015-04-08 ENCOUNTER — Other Ambulatory Visit: Payer: Self-pay | Admitting: Internal Medicine

## 2015-04-08 DIAGNOSIS — F988 Other specified behavioral and emotional disorders with onset usually occurring in childhood and adolescence: Secondary | ICD-10-CM

## 2015-04-08 MED ORDER — AMPHETAMINE-DEXTROAMPHETAMINE 20 MG PO TABS: ORAL_TABLET | ORAL | Status: DC

## 2015-04-29 ENCOUNTER — Encounter: Payer: Self-pay | Admitting: Internal Medicine

## 2015-06-16 ENCOUNTER — Other Ambulatory Visit: Payer: Self-pay | Admitting: Physician Assistant

## 2015-06-16 DIAGNOSIS — F988 Other specified behavioral and emotional disorders with onset usually occurring in childhood and adolescence: Secondary | ICD-10-CM

## 2015-06-16 MED ORDER — AMPHETAMINE-DEXTROAMPHETAMINE 20 MG PO TABS: ORAL_TABLET | ORAL | Status: DC

## 2015-06-28 ENCOUNTER — Ambulatory Visit: Payer: BLUE CROSS/BLUE SHIELD | Admitting: Internal Medicine

## 2015-06-28 NOTE — Progress Notes (Signed)
Patient ID: Charles Tucker, male   DOB: 03-13-1991, 24 y.o.   MRN: [id number]  [facility]  O    S  H  O  W

## 2015-07-08 ENCOUNTER — Encounter: Payer: Self-pay | Admitting: Internal Medicine

## 2015-08-19 ENCOUNTER — Encounter: Payer: Self-pay | Admitting: Internal Medicine

## 2015-08-19 ENCOUNTER — Ambulatory Visit (INDEPENDENT_AMBULATORY_CARE_PROVIDER_SITE_OTHER): Payer: BLUE CROSS/BLUE SHIELD | Admitting: Internal Medicine

## 2015-08-19 VITALS — BP 122/86 | HR 56 | Temp 98.2°F | Resp 18 | Ht 76.0 in | Wt 188.0 lb

## 2015-08-19 DIAGNOSIS — I10 Essential (primary) hypertension: Secondary | ICD-10-CM

## 2015-08-19 DIAGNOSIS — F909 Attention-deficit hyperactivity disorder, unspecified type: Secondary | ICD-10-CM | POA: Diagnosis not present

## 2015-08-19 DIAGNOSIS — F988 Other specified behavioral and emotional disorders with onset usually occurring in childhood and adolescence: Secondary | ICD-10-CM

## 2015-08-19 MED ORDER — AMPHETAMINE-DEXTROAMPHETAMINE 20 MG PO TABS: ORAL_TABLET | ORAL | Status: DC

## 2015-08-19 NOTE — Progress Notes (Signed)
Patient ID: Charles Tucker, male   DOB: 17-Sep-1991, 24 y.o.   MRN: [id number]  Assessment and Plan:  Hypertension:  -Continue medication,  -monitor blood pressure at home.  -Continue DASH diet.   -Reminder to go to the ER if any CP, SOB, nausea, dizziness, severe HA, changes vision/speech, left arm numbness and tingling, and jaw pain.  Vitamin D Def: -check level -continue medications.   Continue diet and meds as discussed. Further disposition pending results of labs.  HPI 24 y.o. male  presents for 3 month follow up with hypertension, hyperlipidemia, prediabetes and vitamin D.   His blood pressure has been controlled at home, today their BP is BP: 122/86 mmHg.   He does workout. He denies chest pain, shortness of breath, dizziness.  He reports that he no longer has any  Ulcers in his stomach and he no longer has to take any acid reflux medications.   Patient is on Vitamin D supplement.  Lab Results  Component Value Date   VD25OH 51 10/11/2014     Patient reports that he is working as a Production designer, theatre/television/film of a gym in [place] and is doing really well.  He reports that he has continued to take his adderall and he feels that he would not function well without taking his adderall.  He takes 30 mg in the morning and then 20 mg at noon.  He reports that he sleeps very well.  He goes a week every month without taking it.  Current Medications:  Current Outpatient Prescriptions on File Prior to Visit  Medication Sig Dispense Refill  . amphetamine-dextroamphetamine (ADDERALL) 20 MG tablet Take 1/2 to 1 tablet 2 x day if needed for ADD 60 tablet 0   No current facility-administered medications on file prior to visit.    Medical History:  Past Medical History  Diagnosis Date  . ADD (attention deficit disorder)   . Allergic rhinitis   . Hypertension     Allergies: No Known Allergies   Review of Systems:  Review of Systems  Constitutional: Negative for fever, chills and malaise/fatigue.  HENT:  Negative for congestion, ear pain and sore throat.   Eyes: Negative.   Respiratory: Negative for cough, shortness of breath and wheezing.   Cardiovascular: Negative for chest pain, palpitations and leg swelling.  Gastrointestinal: Negative for heartburn, diarrhea, constipation, blood in stool and melena.  Genitourinary: Negative.   Neurological: Negative for dizziness, sensory change, loss of consciousness and headaches.  Psychiatric/Behavioral: Negative for depression. The patient is not nervous/anxious and does not have insomnia.     Family history- Review and unchanged  Social history- Review and unchanged  Physical Exam: BP 122/86 mmHg  Pulse 56  Temp(Src) 98.2 F (36.8 C) (Temporal)  Resp 18  Ht  (1.93 m)  Wt 188 lb (85.276 kg)  BMI 22.89 kg/m2 Wt Readings from Last 3 Encounters:  08/19/15 188 lb (85.276 kg)  10/11/14 210 lb (95.255 kg)  06/25/14 218 lb (98.884 kg)    General Appearance: Well nourished well developed, in no apparent distress. Eyes: PERRLA, EOMs, conjunctiva no swelling or erythema ENT/Mouth: Ear canals normal without obstruction, swelling, erythma, discharge.  TMs normal bilaterally.  Oropharynx moist, clear, without exudate, or postoropharyngeal swelling. Neck: Supple, thyroid normal,no cervical adenopathy  Respiratory: Respiratory effort normal, Breath sounds clear A&P without rhonchi, wheeze, or rale.  No retractions, no accessory usage. Cardio: RRR with no MRGs. Brisk peripheral pulses without edema.  Abdomen: Soft, + BS,  Non tender, no guarding,  rebound, hernias, masses. Musculoskeletal: Full ROM, 5/5 strength, Normal gait Skin: Warm, dry without rashes, lesions, ecchymosis.  Neuro: Awake and oriented X 3, Cranial nerves intact. Normal muscle tone, no cerebellar symptoms. Psych: Normal affect, Insight and Judgment appropriate.    Terri Piedra, PA-C 8:52 AM [facility]

## 2015-09-26 ENCOUNTER — Other Ambulatory Visit: Payer: Self-pay | Admitting: Physician Assistant

## 2015-09-26 DIAGNOSIS — F988 Other specified behavioral and emotional disorders with onset usually occurring in childhood and adolescence: Secondary | ICD-10-CM

## 2015-09-26 MED ORDER — AMPHETAMINE-DEXTROAMPHETAMINE 20 MG PO TABS: ORAL_TABLET | ORAL | Status: DC

## 2015-11-16 ENCOUNTER — Other Ambulatory Visit: Payer: Self-pay | Admitting: *Deleted

## 2015-11-16 DIAGNOSIS — F988 Other specified behavioral and emotional disorders with onset usually occurring in childhood and adolescence: Secondary | ICD-10-CM

## 2015-11-16 MED ORDER — AMPHETAMINE-DEXTROAMPHETAMINE 20 MG PO TABS: ORAL_TABLET | ORAL | Status: DC

## 2015-12-27 ENCOUNTER — Other Ambulatory Visit: Payer: Self-pay | Admitting: Physician Assistant

## 2015-12-27 DIAGNOSIS — F988 Other specified behavioral and emotional disorders with onset usually occurring in childhood and adolescence: Secondary | ICD-10-CM

## 2015-12-27 MED ORDER — AMPHETAMINE-DEXTROAMPHETAMINE 20 MG PO TABS: ORAL_TABLET | ORAL | Status: DC

## 2016-02-24 ENCOUNTER — Ambulatory Visit: Payer: Self-pay | Admitting: Internal Medicine

## 2016-07-16 ENCOUNTER — Encounter: Payer: Self-pay | Admitting: Internal Medicine

## 2017-05-21 ENCOUNTER — Ambulatory Visit (INDEPENDENT_AMBULATORY_CARE_PROVIDER_SITE_OTHER): Payer: BLUE CROSS/BLUE SHIELD | Admitting: Internal Medicine

## 2017-05-21 ENCOUNTER — Encounter: Payer: Self-pay | Admitting: Internal Medicine

## 2017-05-21 VITALS — BP 170/86 | HR 76 | Temp 97.3°F | Resp 16 | Ht 76.0 in | Wt 230.0 lb

## 2017-05-21 DIAGNOSIS — E559 Vitamin D deficiency, unspecified: Secondary | ICD-10-CM | POA: Diagnosis not present

## 2017-05-21 DIAGNOSIS — Z79899 Other long term (current) drug therapy: Secondary | ICD-10-CM

## 2017-05-21 DIAGNOSIS — Z0000 Encounter for general adult medical examination without abnormal findings: Secondary | ICD-10-CM | POA: Diagnosis not present

## 2017-05-21 DIAGNOSIS — Z13220 Encounter for screening for lipoid disorders: Secondary | ICD-10-CM

## 2017-05-21 DIAGNOSIS — Z0001 Encounter for general adult medical examination with abnormal findings: Secondary | ICD-10-CM

## 2017-05-21 DIAGNOSIS — B354 Tinea corporis: Secondary | ICD-10-CM

## 2017-05-21 DIAGNOSIS — I10 Essential (primary) hypertension: Secondary | ICD-10-CM

## 2017-05-21 DIAGNOSIS — Z131 Encounter for screening for diabetes mellitus: Secondary | ICD-10-CM

## 2017-05-21 LAB — HEPATIC FUNCTION PANEL
ALT: 32 U/L (ref 9–46)
AST: 29 U/L (ref 10–40)
Albumin: 4.8 g/dL (ref 3.6–5.1)
Alkaline Phosphatase: 117 U/L — ABNORMAL HIGH (ref 40–115)
BILIRUBIN INDIRECT: 0.7 mg/dL (ref 0.2–1.2)
Bilirubin, Direct: 0.2 mg/dL (ref <=0.2)
TOTAL PROTEIN: 6.8 g/dL (ref 6.1–8.1)
Total Bilirubin: 0.9 mg/dL (ref 0.2–1.2)

## 2017-05-21 LAB — CBC WITH DIFFERENTIAL/PLATELET
BASOS ABS: 0 {cells}/uL (ref 0–200)
Basophils Relative: 0 %
EOS PCT: 4 %
Eosinophils Absolute: 308 cells/uL (ref 15–500)
HCT: 49.3 % (ref 38.5–50.0)
Hemoglobin: 16.8 g/dL (ref 13.2–17.1)
Lymphocytes Relative: 26 %
Lymphs Abs: 2002 cells/uL (ref 850–3900)
MCH: 30.4 pg (ref 27.0–33.0)
MCHC: 34.1 g/dL (ref 32.0–36.0)
MCV: 89.2 fL (ref 80.0–100.0)
MONOS PCT: 8 %
MPV: 12.4 fL (ref 7.5–12.5)
Monocytes Absolute: 616 cells/uL (ref 200–950)
NEUTROS PCT: 62 %
Neutro Abs: 4774 cells/uL (ref 1500–7800)
PLATELETS: 191 10*3/uL (ref 140–400)
RBC: 5.53 MIL/uL (ref 4.20–5.80)
RDW: 13.3 % (ref 11.0–15.0)
WBC: 7.7 10*3/uL (ref 3.8–10.8)

## 2017-05-21 LAB — BASIC METABOLIC PANEL WITH GFR
BUN: 10 mg/dL (ref 7–25)
CHLORIDE: 102 mmol/L (ref 98–110)
CO2: 24 mmol/L (ref 20–31)
Calcium: 9.6 mg/dL (ref 8.6–10.3)
Creat: 1.17 mg/dL (ref 0.60–1.35)
GFR, EST NON AFRICAN AMERICAN: 86 mL/min (ref >=60)
GLUCOSE: 86 mg/dL (ref 65–99)
POTASSIUM: 4.5 mmol/L (ref 3.5–5.3)
Sodium: 139 mmol/L (ref 135–146)

## 2017-05-21 LAB — LIPID PANEL
Cholesterol: 132 mg/dL (ref <200)
HDL: 48 mg/dL (ref >40)
LDL CALC: 64 mg/dL (ref <100)
TRIGLYCERIDES: 101 mg/dL (ref <150)
Total CHOL/HDL Ratio: 2.8 Ratio (ref <5.0)
VLDL: 20 mg/dL (ref <30)

## 2017-05-21 LAB — MAGNESIUM: Magnesium: 2.1 mg/dL (ref 1.5–2.5)

## 2017-05-21 MED ORDER — KETOCONAZOLE 2 % EX CREA: 1.0000 "application " | TOPICAL_CREAM | Freq: Every day | CUTANEOUS | 3 refills | Status: AC

## 2017-05-21 MED ORDER — ATENOLOL 50 MG PO TABS: ORAL_TABLET | ORAL | 1 refills | Status: AC

## 2017-05-21 NOTE — Progress Notes (Signed)
[facility]   Charles Tucker, M.D.      Dyanne CarrelAmanda R. Steffanie Dunnollier, P.A.-C [facility]                [address]                [place] [id number] Telephone [phone] Telefax [phone] Annual  Screening/Preventative Visit  & Comprehensive Evaluation & Examination     This very nice 26 y.o. single WM presents for a Screening/Preventative Visit & comprehensive evaluation and management of multiple medical co-morbidities.  Patient has been followed for HTN,  Vitamin D Deficiency and screened expectantly for Prediabetes and Hyperlipidemia.      HTN predates since age 26 yo in 2009 when he was started on Atenolol which he took for years and apparently stopped sometime over the last year.  He has not recently monitored BP's and  today's BP is 170/86 and confirmed. Patient denies any cardiac symptoms as chest pain, palpitations, shortness of breath, dizziness or ankle swelling.     Patient is screened expectantly for elevated Lipids and glucose intolerance.  Patient  patient denies reactive hypoglycemic symptoms, visual blurring, diabetic polys or paresthesias.      Finally, patient has history of Vitamin D Deficiency of "32" in 2013 and last vitamin D was improved.  No current meds.  No Known Allergies   Past Medical History:  Diagnosis Date  . ADD (attention deficit disorder)   . Allergic rhinitis   . Hypertension    Health Maintenance  Topic Date Due  . TETANUS/TDAP  08/26/2010  . INFLUENZA VACCINE  07/31/2017  . HIV Screening  Completed   Immunization History  Administered Date(s) Administered  . HPV Quadrivalent 08/09/2009, 10/11/2009  . Influenza Split 10/11/2014   No past surgical history  Fhx (+) for HTN, HLD, Breast Cancer.   Social History  . Marital status: Single    Spouse name: N/A  . Number of children: N/A  . Years of education: N/A   Occupational History  . 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   Social History Main Topics  . Smoking status: Never Smoker  . Smokeless tobacco: Not on file  . Alcohol use Not on file  . Drug use: Unknown  . Sexual activity: active    ROS Constitutional: Denies fever, chills, weight loss/gain, headaches, insomnia,  night sweats or change in appetite. Does c/o fatigue. Eyes: Denies redness, blurred vision, diplopia, discharge, itchy or watery eyes.  ENT: Denies discharge, congestion, post nasal drip, epistaxis, sore throat, earache, hearing loss, dental pain, Tinnitus, Vertigo, Sinus pain or snoring.  Cardio: Denies chest pain, palpitations, irregular heartbeat, syncope, dyspnea, diaphoresis, orthopnea, PND, claudication or edema Respiratory: denies cough, dyspnea, DOE, pleurisy, hoarseness, laryngitis or wheezing.  Gastrointestinal: Denies dysphagia, heartburn, reflux, water brash, pain, cramps, nausea, vomiting, bloating, diarrhea, constipation, hematemesis, melena, hematochezia, jaundice or hemorrhoids Genitourinary: Denies dysuria, frequency, urgency, nocturia, hesitancy, discharge, hematuria or flank pain Musculoskeletal: Denies arthralgia, myalgia, stiffness, Jt. Swelling, pain, limp or strain/sprain. Denies Falls. Skin: Denies puritis, rash, hives, warts, acne, eczema or change in skin lesion Neuro: No weakness, tremor, incoordination, spasms, paresthesia or pain Psychiatric: Denies confusion, memory loss or sensory loss. Denies Depression. Endocrine: Denies change in weight, skin, hair change, nocturia, and paresthesia, diabetic polys, visual blurring or hyper / hypo glycemic episodes.  Heme/Lymph: No excessive bleeding, bruising or enlarged lymph nodes.  Physical Exam  BP Marland Kitchen(!)  170/86   Pulse 76   Temp 97.3 F (36.3 C)   Resp 16   Ht 6\' 4"  (1.93 m)   Wt 230 lb (104.3 kg)   BMI 28.00 kg/m   General Appearance: Well nourished and well groomed and in no apparent distress.  Eyes: PERRLA,  EOMs, conjunctiva no swelling or erythema, normal fundi and vessels. Sinuses: No frontal/maxillary tenderness ENT/Mouth: EACs patent / TMs  nl. Nares clear without erythema, swelling, mucoid exudates. Oral hygiene is good. No erythema, swelling, or exudate. Tongue normal, non-obstructing. Tonsils not swollen or erythematous. Hearing normal.  Neck: Supple, thyroid normal. No bruits, nodes or JVD. Respiratory: Respiratory effort normal.  BS equal and clear bilateral without rales, rhonci, wheezing or stridor. Cardio: Heart sounds are normal with regular rate and rhythm and no murmurs, rubs or gallops. Peripheral pulses are normal and equal bilaterally without edema. No aortic or femoral bruits. Chest: symmetric with normal excursions and percussion.  Abdomen: Soft, with Nl bowel sounds. Nontender, no guarding, rebound, hernias, masses, or organomegaly.  Lymphatics: Non tender without lymphadenopathy.  Genitourinary: deferred Musculoskeletal: Full ROM all peripheral extremities, joint stability, 5/5 strength, and normal gait. Skin: Warm and dry without  lesions, cyanosis, clubbing or  ecchymosis. Does have classic t.corporis rash of the LUE.  Neuro: Cranial nerves intact, reflexes equal bilaterally. Normal muscle tone, no cerebellar symptoms. Sensation intact.  Pysch: Alert and oriented X 3 with normal affect, insight and judgment appropriate.   Assessment and Plan  1. Annual Preventative/Screening Exam   2. Essential hypertension  - Restart  Rx- Atenolol (TENORMIN) 50 MG tablet; Take 1 tablet daily for BP  Dispense: 90 tablet; Refill: 1 - ROV 3 mo to recheck  - CBC with Differential/Platelet - BASIC METABOLIC PANEL WITH GFR - Magnesium - TSH  3. Screening for cholesterol level  FLAXSEED OIL; Take 1 capsule by mouth daily. - Hepatic function panel - Lipid panel - TSH  4. Screening for diabetes mellitus  - Hemoglobin A1c - Insulin, random  5. Vitamin D deficiency  - VITAMIN D  25 Hydroxy  6. Tinea corporis  - ketoconazole (NIZORAL) 2 % cream; Apply 1 application topically daily. Apply to ringworm rash 2_to_3_x/day  Dispense: 60 g; Refill: 3  7. Medication management  - CBC with Differential/Platelet - BASIC METABOLIC PANEL WITH GFR - Hepatic function panel - Magnesium - Lipid panel - TSH - Hemoglobin A1c - Insulin, random - VITAMIN D 25 Hydroxy        Patient was counseled in prudent diet, weight control to achieve/maintain BMI less than 25, BP monitoring, regular exercise and medications as discussed.  Discussed med effects and SE's. Routine screening labs and tests as requested with regular follow-up as recommended. Over 40 minutes of exam, counseling, chart review and high complex critical decision making was performed

## 2017-05-21 NOTE — Patient Instructions (Addendum)
Preventive Care for Adults  A healthy lifestyle and preventive care can promote health and wellness. Preventive health guidelines for men include the following key practices:  A routine yearly physical is a good way to check with your health care provider about your health and preventative screening. It is a chance to share any concerns and updates on your health and to receive a thorough exam.  Visit your dentist for a routine exam and preventative care every 6 months. Brush your teeth twice a day and floss once a day. Good oral hygiene prevents tooth decay and gum disease.  The frequency of eye exams is based on your age, health, family medical history, use of contact lenses, and other factors. Follow your health care provider's recommendations for frequency of eye exams.  Eat a healthy diet. Foods such as vegetables, fruits, whole grains, low-fat dairy products, and lean protein foods contain the nutrients you need without too many calories. Decrease your intake of foods high in solid fats, added sugars, and salt. Eat the right amount of calories for you.Get information about a proper diet from your health care provider, if necessary.  Regular physical exercise is one of the most important things you can do for your health. Most adults should get at least 150 minutes of moderate-intensity exercise (any activity that increases your heart rate and causes you to sweat) each week. In addition, most adults need muscle-strengthening exercises on 2 or more days a week.  Maintain a healthy weight. The body mass index (BMI) is a screening tool to identify possible weight problems. It provides an estimate of body fat based on height and weight. Your health care provider can find your BMI and can help you achieve or maintain a healthy weight.For adults 20 years and older:  A BMI below 18.5 is considered underweight.  A BMI of 18.5 to 24.9 is normal.  A BMI of 25 to 29.9 is considered overweight.  A  BMI of 30 and above is considered obese.  Maintain normal blood lipids and cholesterol levels by exercising and minimizing your intake of saturated fat. Eat a balanced diet with plenty of fruit and vegetables. Blood tests for lipids and cholesterol should begin at age 20 and be repeated every 5 years. If your lipid or cholesterol levels are high, you are over 50, or you are at high risk for heart disease, you may need your cholesterol levels checked more frequently.Ongoing high lipid and cholesterol levels should be treated with medicines if diet and exercise are not working.  If you smoke, find out from your health care provider how to quit. If you do not use tobacco, do not start.  Lung cancer screening is recommended for adults aged 55-80 years who are at high risk for developing lung cancer because of a history of smoking. A yearly low-dose CT scan of the lungs is recommended for people who have at least a 30-pack-year history of smoking and are a current smoker or have quit within the past 15 years. A pack year of smoking is smoking an average of 1 pack of cigarettes a day for 1 year (for example: 1 pack a day for 30 years or 2 packs a day for 15 years). Yearly screening should continue until the smoker has stopped smoking for at least 15 years. Yearly screening should be stopped for people who develop a health problem that would prevent them from having lung cancer treatment.  If you choose to drink alcohol, do not have more   than 2 drinks per day. One drink is considered to be 12 ounces (355 mL) of beer, 5 ounces (148 mL) of wine, or 1.5 ounces (44 mL) of liquor.  High blood pressure causes heart disease and increases the risk of stroke. Your blood pressure should be checked. Ongoing high blood pressure should be treated with medicines, if weight loss and exercise are not effective.  If you are 38-65 years old, ask your health care provider if you should take aspirin to prevent heart  disease.  Diabetes screening involves taking a blood sample to check your fasting blood sugar level. Testing should be considered at a younger age or be carried out more frequently if you are overweight and have at least 1 risk factor for diabetes.  Colorectal cancer can be detected and often prevented. Most routine colorectal cancer screening begins at the age of 56 and continues through age 26. However, your health care provider may recommend screening at an earlier age if you have risk factors for colon cancer. On a yearly basis, your health care provider may provide home test kits to check for hidden blood in the stool. Use of a small camera at the end of a tube to directly examine the colon (sigmoidoscopy or colonoscopy) can detect the earliest forms of colorectal cancer. Talk to your health care provider about this at age 96, when routine screening begins. Direct exam of the colon should be repeated every 5-10 years through age 69, unless early forms of precancerous polyps or small growths are found.  Screening for abdominal aortic aneurysm (AAA)  are recommended for persons over age 25 who have history of hypertensionor who are current or former smokers.  Talk with your health care provider about prostate cancer screening.  Testicular cancer screening is recommended for adult males. Screening includes self-exam, a health care provider exam, and other screening tests. Consult with your health care provider about any symptoms you have or any concerns you have about testicular cancer.  Use sunscreen. Apply sunscreen liberally and repeatedly throughout the day. You should seek shade when your shadow is shorter than you. Protect yourself by wearing long sleeves, pants, a wide-brimmed hat, and sunglasses year round, whenever you are outdoors.  Once a month, do a whole-body skin exam, using a mirror to look at the skin on your back. Tell your health care provider about new moles, moles that have  irregular borders, moles that are larger than a pencil eraser, or moles that have changed in shape or color.  Stay current with required vaccines (immunizations).  Influenza vaccine. All adults should be immunized every year.  Tetanus, diphtheria, and acellular pertussis (Td, Tdap) vaccine. An adult who has not previously received Tdap or who does not know his vaccine status should receive 1 dose of Tdap. This initial dose should be followed by tetanus and diphtheria toxoids (Td) booster doses every 10 years. Adults with an unknown or incomplete history of completing a 3-dose immunization series with Td-containing vaccines should begin or complete a primary immunization series including a Tdap dose. Adults should receive a Td booster every 10 years.  Zoster vaccine. One dose is recommended for adults aged 26 years or older unless certain conditions are present.    Pneumococcal 13-valent conjugate (PCV13) vaccine. When indicated, a person who is uncertain of his immunization history and has no record of immunization should receive the PCV13 vaccine. An adult aged 52 years or older who has certain medical conditions and has not been previously immunized  should receive 1 dose of PCV13 vaccine. This PCV13 should be followed with a dose of pneumococcal polysaccharide (PPSV23) vaccine. The PPSV23 vaccine dose should be obtained at least 8 weeks after the dose of PCV13 vaccine. An adult aged 104 years or older who has certain medical conditions and previously received 1 or more doses of PPSV23 vaccine should receive 1 dose of PCV13. The PCV13 vaccine dose should be obtained 1 or more years after the last PPSV23 vaccine dose.    Pneumococcal polysaccharide (PPSV23) vaccine. When PCV13 is also indicated, PCV13 should be obtained first. All adults aged 71 years and older should be immunized. An adult younger than age 40 years who has certain medical conditions should be immunized. Any person who resides in a  nursing home or long-term care facility should be immunized. An adult smoker should be immunized. People with an immunocompromised condition and certain other conditions should receive both PCV13 and PPSV23 vaccines. People with human immunodeficiency virus (HIV) infection should be immunized as soon as possible after diagnosis. Immunization during chemotherapy or radiation therapy should be avoided. Routine use of PPSV23 vaccine is not recommended for American Indians, [place] Natives, or people younger than 65 years unless there are medical conditions that require PPSV23 vaccine. When indicated, people who have unknown immunization and have no record of immunization should receive PPSV23 vaccine. One-time revaccination 5 years after the first dose of PPSV23 is recommended for people aged 19-64 years who have chronic kidney failure, nephrotic syndrome, asplenia, or immunocompromised conditions. People who received 1-2 doses of PPSV23 before age 82 years should receive another dose of PPSV23 vaccine at age 75 years or later if at least 5 years have passed since the previous dose. Doses of PPSV23 are not needed for people immunized with PPSV23 at or after age 49 years.  Hepatitis A vaccine. Adults who wish to be protected from this disease, have certain high-risk conditions, work with hepatitis A-infected animals, work in hepatitis A research labs, or travel to or work in countries with a high rate of hepatitis A should be immunized. Adults who were previously unvaccinated and who anticipate close contact with an international adoptee during the first 60 days after arrival in the [place] from a country with a high rate of hepatitis A should be immunized.  Hepatitis B vaccine. Adults should be immunized if they wish to be protected from this disease, have certain high-risk conditions, may be exposed to blood or other infectious body fluids, are household contacts or sex partners of hepatitis B positive  people, are clients or workers in certain care facilities, or travel to or work in countries with a high rate of hepatitis B.  Preventive Service / Frequency  Ages 59 to 45  Blood pressure check.  Lipid and cholesterol check.  Hepatitis C blood test.** / For any individual with known risks for hepatitis C.  Skin self-exam. / Monthly.  Influenza vaccine. / Every year.  Tetanus, diphtheria, and acellular pertussis (Tdap, Td) vaccine.** / Consult your health care provider. 1 dose of Td every 10 years.  HPV vaccine. / 3 doses over 6 months, if 51 or younger.  Measles, mumps, rubella (MMR) vaccine.** / You need at least 1 dose of MMR if you were born in 1957 or later. You may also need a second dose.  Pneumococcal 13-valent conjugate (PCV13) vaccine.** / Consult your health care provider.  Pneumococcal polysaccharide (PPSV23) vaccine.** / 1 to 2 doses if you smoke cigarettes or if you have  certain conditions.  Meningococcal vaccine.** / 1 dose if you are age 65 to 56 years and a Market researcher living in a residence hall, or have one of several medical conditions. You may also need additional booster doses.  Hepatitis A vaccine.** / Consult your health care provider.  Hepatitis B vaccine.** / Consult your health care provider. +++++++++ Recommend Adult Low Dose Aspirin or  coated  Aspirin 81 mg daily  To reduce risk of Colon Cancer 20 %,  Skin Cancer 26 % ,  Melanoma 46%  and  Pancreatic cancer 60% ++++++++++++++++++ Vitamin D goal  is between 70-100.  Please make sure that you are taking your Vitamin D as directed.  It is very important as a natural anti-inflammatory  helping hair, skin, and nails, as well as reducing stroke and heart attack risk.  It helps your bones and helps with mood. It also decreases numerous cancer risks so please take it as directed.  Low Vit D is associated with a 200-300% higher risk for CANCER  and 200-300% higher risk for HEART    ATTACK  &  STROKE.   .....................................Marland Kitchen It is also associated with higher death rate at younger ages,  autoimmune diseases like Rheumatoid arthritis, Lupus, Multiple Sclerosis.    Also many other serious conditions, like depression, Alzheimer's Dementia, infertility, muscle aches, fatigue, fibromyalgia - just to name a few. +++++++++++++++++++++ Recommend the book "The END of DIETING" by Dr Excell Seltzer  & the book "The END of DIABETES " by Dr Excell Seltzer At [email or web address] - get book & Audio CD's    Being diabetic has a  300% increased risk for heart attack, stroke, cancer, and alzheimer- type vascular dementia. It is very important that you work harder with diet by avoiding all foods that are white. Avoid white rice (brown & wild rice is OK), white potatoes (sweetpotatoes in moderation is OK), White bread or wheat bread or anything made out of white flour like bagels, donuts, rolls, buns, biscuits, cakes, pastries, cookies, pizza crust, and pasta (made from white flour & egg whites) - vegetarian pasta or spinach or wheat pasta is OK. Multigrain breads like Arnold's or [employer], or multigrain sandwich thins or flatbreads.  Diet, exercise and weight loss can reverse and cure diabetes in the early stages.  Diet, exercise and weight loss is very important in the control and prevention of complications of diabetes which affects every system in your body, ie. Brain - dementia/stroke, eyes - glaucoma/blindness, heart - heart attack/heart failure, kidneys - dialysis, stomach - gastric paralysis, intestines - malabsorption, nerves - severe painful neuritis, circulation - gangrene & loss of a leg(s), and finally cancer and Alzheimers.    I recommend avoid fried & greasy foods,  sweets/candy, white rice (brown or wild rice or Quinoa is OK), white potatoes (sweet potatoes are OK) - anything made from white flour - bagels, doughnuts, rolls, buns, biscuits,white and wheat breads, pizza crust  and traditional pasta made of white flour & egg white(vegetarian pasta or spinach or wheat pasta is OK).  Multi-grain bread is OK - like multi-grain flat bread or sandwich thins. Avoid alcohol in excess. Exercise is also important.    Eat all the vegetables you want - avoid meat, especially red meat and dairy - especially cheese.  Cheese is the most concentrated form of trans-fats which is the worst thing to clog up our arteries. Veggie cheese is OK which can be found in the fresh produce section at [facility] or  [employer] or [employer]  +++++++++++++++++++ DASH Eating Plan  DASH stands for "Dietary Approaches to Stop Hypertension."   The DASH eating plan is a healthy eating plan that has been shown to reduce high blood pressure (hypertension). Additional health benefits may include reducing the risk of type 2 diabetes mellitus, heart disease, and stroke. The DASH eating plan may also help with weight loss. WHAT DO I NEED TO KNOW ABOUT THE DASH EATING PLAN? For the DASH eating plan, you will follow these general guidelines:  Choose foods with a percent daily value for sodium of less than 5% (as listed on the food label).  Use salt-free seasonings or herbs instead of table salt or sea salt.  Check with your health care provider or pharmacist before using salt substitutes.  Eat lower-sodium products, often labeled as "lower sodium" or "no salt added."  Eat fresh foods.  Eat more vegetables, fruits, and low-fat dairy products.  Choose whole grains. Look for the word "whole" as the first word in the ingredient list.  Choose fish   Limit sweets, desserts, sugars, and sugary drinks.  Choose heart-healthy fats.  Eat veggie cheese   Eat more home-cooked food and less restaurant, buffet, and fast food.  Limit fried foods.  Cook foods using methods other than frying.  Limit canned vegetables. If you do use them, rinse them well to decrease the sodium.  When eating at a  restaurant, ask that your food be prepared with less salt, or no salt if possible.                      WHAT FOODS CAN I EAT? Read Dr Fara Olden Fuhrman's books on The End of Dieting & The End of Diabetes  Grains Whole grain or whole wheat bread. Brown rice. Whole grain or whole wheat pasta. Quinoa, bulgur, and whole grain cereals. Low-sodium cereals. Corn or whole wheat flour tortillas. Whole grain cornbread. Whole grain crackers. Low-sodium crackers.  Vegetables Fresh or frozen vegetables (raw, steamed, roasted, or grilled). Low-sodium or reduced-sodium tomato and vegetable juices. Low-sodium or reduced-sodium tomato sauce and paste. Low-sodium or reduced-sodium canned vegetables.   Fruits All fresh, canned (in natural juice), or frozen fruits.  Protein Products  All fish and seafood.  Dried beans, peas, or lentils. Unsalted nuts and seeds. Unsalted canned beans.  Dairy Low-fat dairy products, such as skim or 1% milk, 2% or reduced-fat cheeses, low-fat ricotta or cottage cheese, or plain low-fat yogurt. Low-sodium or reduced-sodium cheeses.  Fats and Oils Tub margarines without trans fats. Light or reduced-fat mayonnaise and salad dressings (reduced sodium). Avocado. Safflower, olive, or canola oils. Natural peanut or almond butter.  Other Unsalted popcorn and pretzels. The items listed above may not be a complete list of recommended foods or beverages. Contact your dietitian for more options.  +++++++++++++++++++  WHAT FOODS ARE NOT RECOMMENDED? Grains/ White flour or wheat flour White bread. White pasta. White rice. Refined cornbread. Bagels and croissants. Crackers that contain trans fat.  Vegetables  Creamed or fried vegetables. Vegetables in a . Regular canned vegetables. Regular canned tomato sauce and paste. Regular tomato and vegetable juices.  Fruits Dried fruits. Canned fruit in light or heavy syrup. Fruit juice.  Meat and Other Protein Products Meat in general - RED  meat & White meat.  Fatty cuts of meat. Ribs, chicken wings, all processed meats as bacon, sausage, bologna, salami, fatback, hot dogs, bratwurst and packaged luncheon meats.  Dairy Whole or 2% milk, cream,  half-and-half, and cream cheese. Whole-fat or sweetened yogurt. Full-fat cheeses or blue cheese. Non-dairy creamers and whipped toppings. Processed cheese, cheese spreads, or cheese curds.  Condiments Onion and garlic salt, seasoned salt, table salt, and sea salt. Canned and packaged gravies. Worcestershire sauce. Tartar sauce. Barbecue sauce. Teriyaki sauce. Soy sauce, including reduced sodium. Steak sauce. Fish sauce. Oyster sauce. Cocktail sauce. Horseradish. Ketchup and mustard. Meat flavorings and tenderizers. Bouillon cubes. Hot sauce. Tabasco sauce. Marinades. Taco seasonings. Relishes.  Fats and Oils Butter, stick margarine, lard, shortening and bacon fat. Coconut, palm kernel, or palm oils. Regular salad dressings.  Pickles and olives. Salted popcorn and pretzels.  The items listed above may not be a complete list of foods and beverages to avoid.  ++++++++++++++++++  Body Ringworm Body ringworm is an infection of the skin that often causes a ring-shaped rash. Body ringworm can affect any part of your skin. It can spread easily to others. Body ringworm is also called tinea corporis. What are the causes? This condition is caused by funguses called dermatophytes. The condition develops when these funguses grow out of control on the skin. You can get this condition if you touch a person or animal that has it. You can also get it if you share clothing, bedding, towels, or any other object with an infected person or pet. What increases the risk? This condition is more likely to develop in:  Athletes who often make skin-to-skin contact with other athletes, such as wrestlers.  People who share equipment and mats.  People with a weakened immune system. What are the signs or  symptoms? Symptoms of this condition include:  Itchy, raised red spots and bumps.  Red scaly patches.  A ring-shaped rash. The rash may have:  A clear center.  Scales or red bumps at its center.  Redness near its borders.  Dry and scaly skin on or around it. How is this diagnosed? This condition can usually be diagnosed with a skin exam. A skin scraping may be taken from the affected area and examined under a microscope to see if the fungus is present. How is this treated? This condition may be treated with:  An antifungal cream or ointment.  An antifungal shampoo.  Antifungal medicines. These may be prescribed if your ringworm is severe, keeps coming back, or lasts a long time. Follow these instructions at home:  Take over-the-counter and prescription medicines only as told by your health care provider.  If you were given an antifungal cream or ointment:  Use it as told by your health care provider.  Wash the infected area and dry it completely before applying the cream or ointment.  If you were given an antifungal shampoo:  Use it as told by your health care provider.  Leave the shampoo on your body for 3-5 minutes before rinsing.  While you have a rash:  Wear loose clothing to stop clothes from rubbing and irritating it.  Wash or change your bed sheets every night.  If your pet has the same infection, take your pet to see a International aid/development worker. How is this prevented?  Practice good hygiene.  Wear sandals or shoes in public places and showers.  Do not share personal items with others.  Avoid touching red patches of skin on other people.  Avoid touching pets that have bald spots.  If you touch an animal that has a bald spot, wash your hands. Contact a health care provider if:  Your rash continues to spread after 7 days  of treatment.  Your rash is not gone in 4 weeks.  The area around your rash gets red, warm, tender, and swollen. This information is not  intended to replace advice given to you by your health care provider. Make sure you discuss any questions you have with your health care provider. Document Released: 12/14/2000 Document Revised: 05/24/2016 Document Reviewed: 10/13/2015 Elsevier Interactive Patient Education  2017 [employer].

## 2017-05-22 LAB — INSULIN, RANDOM: INSULIN: 8.4 u[IU]/mL (ref 2.0–19.6)

## 2017-05-22 LAB — VITAMIN D 25 HYDROXY (VIT D DEFICIENCY, FRACTURES): Vit D, 25-Hydroxy: 43 ng/mL (ref 30–100)

## 2017-05-22 LAB — TSH: TSH: 2.24 m[IU]/L (ref 0.40–4.50)

## 2017-05-22 LAB — HEMOGLOBIN A1C
HEMOGLOBIN A1C: 4.8 % (ref <5.7)
MEAN PLASMA GLUCOSE: 91 mg/dL

## 2017-08-30 ENCOUNTER — Ambulatory Visit: Payer: Self-pay | Admitting: Internal Medicine

## 2017-08-30 NOTE — Progress Notes (Signed)
NO SHOW
